# Patient Record
Sex: Female | Born: 1955 | Race: White | Hispanic: No | Marital: Single | Smoking: Current some day smoker
Health system: Southern US, Community
[De-identification: ages and names within clinical notes are randomized; demographics above are authoritative.]

---

## 2001-02-19 ENCOUNTER — Emergency Department (HOSPITAL_COMMUNITY): Admission: EM | Admit: 2001-02-19 | Discharge: 2001-02-19 | Payer: Self-pay | Admitting: Internal Medicine

## 2001-04-18 ENCOUNTER — Encounter: Payer: Self-pay | Admitting: Emergency Medicine

## 2001-04-18 ENCOUNTER — Emergency Department (HOSPITAL_COMMUNITY): Admission: EM | Admit: 2001-04-18 | Discharge: 2001-04-19 | Payer: Self-pay | Admitting: Emergency Medicine

## 2001-04-28 ENCOUNTER — Encounter: Admission: RE | Admit: 2001-04-28 | Discharge: 2001-04-28 | Payer: Self-pay | Admitting: Internal Medicine

## 2002-06-26 ENCOUNTER — Emergency Department (HOSPITAL_COMMUNITY): Admission: EM | Admit: 2002-06-26 | Discharge: 2002-06-26 | Payer: Self-pay | Admitting: Emergency Medicine

## 2002-12-22 ENCOUNTER — Emergency Department (HOSPITAL_COMMUNITY): Admission: EM | Admit: 2002-12-22 | Discharge: 2002-12-22 | Payer: Self-pay | Admitting: Emergency Medicine

## 2002-12-22 ENCOUNTER — Encounter: Payer: Self-pay | Admitting: Emergency Medicine

## 2015-11-28 ENCOUNTER — Emergency Department (INDEPENDENT_AMBULATORY_CARE_PROVIDER_SITE_OTHER)
Admission: EM | Admit: 2015-11-28 | Discharge: 2015-11-28 | Disposition: A | Discharge status: Home / Self Care | Payer: BLUE CROSS/BLUE SHIELD | Source: Home / Self Care | Attending: Family Medicine | Admitting: Family Medicine

## 2015-11-28 ENCOUNTER — Encounter (HOSPITAL_COMMUNITY): Payer: Self-pay | Admitting: *Deleted

## 2015-11-28 DIAGNOSIS — J111 Influenza due to unidentified influenza virus with other respiratory manifestations: Secondary | ICD-10-CM

## 2015-11-28 MED ORDER — IPRATROPIUM BROMIDE 0.06 % NA SOLN: 2.0000 | Freq: Four times a day (QID) | NASAL | Status: AC

## 2015-11-28 NOTE — ED Notes (Signed)
Pt  Reports   Symptoms  Of  Hoarseness   And    Sinus  Drainage       X   1   Week      Pt  Sitting  Upright on the  Exam table  Speaking in  Complete  sentances

## 2015-11-28 NOTE — ED Provider Notes (Signed)
CSN: [id number]     Arrival date & time 11/28/15  1726 History   First MD Initiated Contact with Patient 11/28/15 1926     Chief Complaint  Patient presents with  . Laryngitis   (Consider location/radiation/quality/duration/timing/severity/associated sxs/prior Treatment) Patient is a 60 y.o. female presenting with URI. The history is provided by the patient.  URI Presenting symptoms: congestion and rhinorrhea   Presenting symptoms: no cough, no facial pain and no fever   Presenting symptoms comment:  Laryngitis  Severity:  Mild Onset quality:  Sudden Duration:  2 days Progression:  Unchanged Chronicity:  New Ineffective treatments:  OTC medications Risk factors: no sick contacts   Risk factors comment:  No flu vaccine, just moved here from [place]. 4mos ago.   History reviewed. No pertinent past medical history. History reviewed. No pertinent past surgical history. History reviewed. No pertinent family history. Social History  Substance Use Topics  . Smoking status: Current Some Day Smoker  . Smokeless tobacco: None  . Alcohol Use: No   OB History    No data available     Review of Systems  Constitutional: Negative.  Negative for fever.  HENT: Positive for congestion, postnasal drip, rhinorrhea and voice change.   Respiratory: Negative.  Negative for cough.   All other systems reviewed and are negative.   Allergies  Review of patient's allergies indicates no known allergies.  Home Medications   Prior to Admission medications   Medication Sig Start Date End Date Taking? Authorizing Provider  Pseudoephedrine-APAP-DM (DAYQUIL PO) Take by mouth.   Yes Historical Provider, MD  ipratropium (ATROVENT) 0.06 % nasal spray Place 2 sprays into both nostrils 4 (four) times daily. 11/28/15   Linna Hoff, MD   Meds Ordered and Administered this Visit  Medications - No data to display  BP 150/90 mmHg  Pulse 95  Temp(Src) 98.8 F (37.1 C) (Oral)  Resp 16  SpO2 97% No data  found.   Physical Exam  Constitutional: She is oriented to person, place, and time. She appears well-developed and well-nourished.  HENT:  Right Ear: External ear normal.  Left Ear: External ear normal.  Mouth/Throat: Oropharynx is clear and moist. No oropharyngeal exudate.  Neck: Normal range of motion. Neck supple.  Cardiovascular: Normal rate, regular rhythm, normal heart sounds and intact distal pulses.   Pulmonary/Chest: Effort normal and breath sounds normal.  Lymphadenopathy:    She has no cervical adenopathy.  Neurological: She is alert and oriented to person, place, and time.  Skin: Skin is warm and dry.  Nursing note and vitals reviewed.   ED Course  Procedures (including critical care time)  Labs Review Labs Reviewed - No data to display  Imaging Review No results found.   Visual Acuity Review  Right Eye Distance:   Left Eye Distance:   Bilateral Distance:    Right Eye Near:   Left Eye Near:    Bilateral Near:         MDM   1. Acute laryngitis with influenza        Linna Hoff, MD 11/28/15 1950

## 2015-11-28 NOTE — Discharge Instructions (Signed)
Drink plenty of fluids as discussed, use medicine as prescribed, and mucinex or delsym for cough. Return or see your doctor if further problems °

## 2015-12-06 ENCOUNTER — Encounter (HOSPITAL_COMMUNITY): Payer: Self-pay | Admitting: *Deleted

## 2015-12-06 ENCOUNTER — Emergency Department (INDEPENDENT_AMBULATORY_CARE_PROVIDER_SITE_OTHER)
Admission: EM | Admit: 2015-12-06 | Discharge: 2015-12-06 | Disposition: A | Discharge status: Home / Self Care | Payer: BLUE CROSS/BLUE SHIELD | Source: Home / Self Care | Attending: Emergency Medicine | Admitting: Emergency Medicine

## 2015-12-06 DIAGNOSIS — J040 Acute laryngitis: Secondary | ICD-10-CM | POA: Diagnosis not present

## 2015-12-06 DIAGNOSIS — J069 Acute upper respiratory infection, unspecified: Secondary | ICD-10-CM | POA: Diagnosis not present

## 2015-12-06 DIAGNOSIS — Z720 Tobacco use: Secondary | ICD-10-CM

## 2015-12-06 MED ORDER — AZITHROMYCIN 250 MG PO TABS: ORAL_TABLET | ORAL | Status: AC

## 2015-12-06 NOTE — Discharge Instructions (Signed)
Laryngitis  Laryngitis is inflammation of your vocal cords. This causes hoarseness, coughing, loss of voice, sore throat, or a dry throat. Your vocal cords are two bands of muscles that are found in your throat. When you speak, these cords come together and vibrate. These vibrations come out through your mouth as sound. When your vocal cords are inflamed, your voice sounds different.  Laryngitis can be temporary (acute) or long-term (chronic). Most cases of acute laryngitis improve with time. Chronic laryngitis is laryngitis that lasts for more than three weeks.  CAUSES  Acute laryngitis may be caused by:   A viral infection.   Lots of talking, yelling, or singing. This is also called vocal strain.   Bacterial infections.  Chronic laryngitis may be caused by:   Vocal strain.   Injury to your vocal cords.   Acid reflux (gastroesophageal reflux disease or GERD).   Allergies.   Sinus infection.   Smoking.   Alcohol abuse.   Breathing in chemicals or dust.   Growths on the vocal cords.  RISK FACTORS  Risk factors for laryngitis include:   Smoking.   Alcohol abuse.   Having allergies.  SIGNS AND SYMPTOMS  Symptoms of laryngitis may include:   Low, hoarse voice.   Loss of voice.   Dry cough.   Sore throat.   Stuffy nose.  DIAGNOSIS  Laryngitis may be diagnosed by:   Physical exam.   Throat culture.   Blood test.   Laryngoscopy. This procedure allows your health care provider to look at your vocal cords with a mirror or viewing tube.  TREATMENT  Treatment for laryngitis depends on what is causing it. Usually, treatment involves resting your voice and using medicines to soothe your throat. However, if your laryngitis is caused by a bacterial infection, you may need to take antibiotic medicine. If your laryngitis is caused by a growth, you may need to have a procedure to remove it.  HOME CARE INSTRUCTIONS   Drink enough fluid to keep your urine clear or pale yellow.   Breathe in moist air. Use a  humidifier if you live in a dry climate.   Take medicines only as directed by your health care provider.   If you were prescribed an antibiotic medicine, finish it all even if you start to feel better.   Do not smoke cigarettes or electronic cigarettes. If you need help quitting, ask your health care provider.   Talk as little as possible. Also avoid whispering, which can cause vocal strain.   Write instead of talking. Do this until your voice is back to normal.  SEEK MEDICAL CARE IF:   You have a fever.   You have increasing pain.   You have difficulty swallowing.  SEEK IMMEDIATE MEDICAL CARE IF:   You cough up blood.   You have trouble breathing.     This information is not intended to replace advice given to you by your health care provider. Make sure you discuss any questions you have with your health care provider.     Document Released: 09/24/2005 Document Revised: 10/15/2014 Document Reviewed: 03/09/2014  [employer]vier Interactive Patient Education 2016 [employer]

## 2015-12-06 NOTE — ED Provider Notes (Signed)
CSN: [id number]     Arrival date & time 12/06/15  1336 History   First MD Initiated Contact with Patient 12/06/15 1452     Chief Complaint  Patient presents with  . URI   (Consider location/radiation/quality/duration/timing/severity/associated sxs/prior Treatment) Patient is a 60 y.o. female presenting with URI. The history is provided by the patient.  URI Presenting symptoms: congestion   Severity:  Moderate Onset quality:  Gradual Duration:  10 days Timing:  Constant Progression:  Unchanged Chronicity:  Recurrent Relieved by:  Nothing Worsened by:  Nothing tried Ineffective treatments:  Decongestant and OTC medications Associated symptoms: sneezing   Associated symptoms comment:  Hoarse voice   No past medical history on file. No past surgical history on file. No family history on file. Social History  Substance Use Topics  . Smoking status: Current Some Day Smoker  . Smokeless tobacco: Not on file  . Alcohol Use: No   OB History    No data available     Review of Systems  Constitutional: Negative.   HENT: Positive for congestion, sneezing and voice change.   Eyes: Negative.   Respiratory: Negative.   Cardiovascular: Negative.   Endocrine: Negative.   Genitourinary: Negative.   Allergic/Immunologic: Negative.   Neurological: Negative.   Hematological: Negative.   Psychiatric/Behavioral: Negative.     Allergies  Review of patient's allergies indicates no known allergies.  Home Medications   Prior to Admission medications   Medication Sig Start Date End Date Taking? Authorizing Provider  ipratropium (ATROVENT) 0.06 % nasal spray Place 2 sprays into both nostrils 4 (four) times daily. 11/28/15   Linna Hoff, MD  Pseudoephedrine-APAP-DM (DAYQUIL PO) Take by mouth.    Historical Provider, MD   Meds Ordered and Administered this Visit  Medications - No data to display  BP 127/84 mmHg  Pulse 97  Temp(Src) 99.3 F (37.4 C) (Oral)  Resp 16  SpO2 98% No  data found.   Physical Exam  Constitutional: She is oriented to person, place, and time. She appears well-developed and well-nourished.  HENT:  Head: Normocephalic.  Right Ear: External ear normal.  Left Ear: External ear normal.  Mouth/Throat: Oropharynx is clear and moist.  Eyes: Conjunctivae and EOM are normal. Pupils are equal, round, and reactive to light.  Neck: Normal range of motion. Neck supple.  Cardiovascular: Normal rate and normal heart sounds.   Pulmonary/Chest: Effort normal and breath sounds normal.  Abdominal: Soft. Bowel sounds are normal.  Musculoskeletal: Normal range of motion.  Neurological: She is alert and oriented to person, place, and time.  Skin: Skin is warm and dry.    ED Course  Procedures (including critical care time)  Labs Review Labs Reviewed - No data to display  Imaging Review No results found.   Visual Acuity Review  Right Eye Distance:   Left Eye Distance:   Bilateral Distance:    Right Eye Near:   Left Eye Near:    Bilateral Near:         MDM  URI Laryngitis Tobacco abuse  Zithromax as directed since over a week of sx's and smoker.  Discussed voice rest and not to smoke. Push po fluids, rest, tylenol and motrin otc prn as directed for fever, arthralgias, and myalgias.  Follow up prn if sx's continue or persist.  Deatra Canter FNP      Deatra Canter, FNP 12/06/15 1745

## 2015-12-06 NOTE — ED Notes (Signed)
Pt  Reports   Symptoms     Of       Laryngitis            Pt  Was       Seen  Er      1  Week        Symptoms  X  10  Days  Ago          pt      Reports  meds  Not  releiving  The  Symptoms

## 2015-12-07 ENCOUNTER — Telehealth (HOSPITAL_COMMUNITY): Payer: Self-pay | Admitting: Emergency Medicine
